# Patient Record
Sex: Female | Born: 1955 | Race: White | Hispanic: No | Marital: Married | Smoking: Never smoker
Health system: Southern US, Community
[De-identification: ages and names within clinical notes are randomized; demographics above are authoritative.]

## PROBLEM LIST (undated history)

## (undated) DIAGNOSIS — I10 Essential (primary) hypertension: Secondary | ICD-10-CM

## (undated) HISTORY — DX: Essential (primary) hypertension: I10

---
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
## 1986-05-12 HISTORY — PX: TUBAL LIGATION: SHX77

## 1995-05-13 HISTORY — PX: REVISION OF SCAR TISSUE RECTUS MUSCLE: SHX2351

## 1996-05-12 HISTORY — PX: VAGINAL HYSTERECTOMY: SUR661

## 2004-05-12 HISTORY — PX: BACK SURGERY: SHX140

## 2005-04-09 ENCOUNTER — Ambulatory Visit (HOSPITAL_COMMUNITY): Admission: RE | Admit: 2005-04-09 | Discharge: 2005-04-10 | Payer: Self-pay | Admitting: Neurosurgery

## 2006-01-14 ENCOUNTER — Emergency Department (HOSPITAL_COMMUNITY): Admission: EM | Admit: 2006-01-14 | Discharge: 2006-01-14 | Payer: Self-pay | Admitting: Emergency Medicine

## 2012-10-19 ENCOUNTER — Encounter: Payer: Self-pay | Admitting: Obstetrics and Gynecology

## 2012-10-20 ENCOUNTER — Ambulatory Visit (INDEPENDENT_AMBULATORY_CARE_PROVIDER_SITE_OTHER): Payer: BC Managed Care – PPO | Admitting: Gynecology

## 2012-10-20 ENCOUNTER — Encounter: Payer: Self-pay | Admitting: Gynecology

## 2012-10-20 ENCOUNTER — Ambulatory Visit: Payer: Self-pay | Admitting: Obstetrics and Gynecology

## 2012-10-20 VITALS — BP 138/80 | Ht 66.75 in | Wt 232.0 lb

## 2012-10-20 DIAGNOSIS — B372 Candidiasis of skin and nail: Secondary | ICD-10-CM

## 2012-10-20 DIAGNOSIS — Z01419 Encounter for gynecological examination (general) (routine) without abnormal findings: Secondary | ICD-10-CM

## 2012-10-20 DIAGNOSIS — F329 Major depressive disorder, single episode, unspecified: Secondary | ICD-10-CM

## 2012-10-20 DIAGNOSIS — F341 Dysthymic disorder: Secondary | ICD-10-CM

## 2012-10-20 DIAGNOSIS — Z79890 Hormone replacement therapy (postmenopausal): Secondary | ICD-10-CM

## 2012-10-20 DIAGNOSIS — I10 Essential (primary) hypertension: Secondary | ICD-10-CM

## 2012-10-20 DIAGNOSIS — N951 Menopausal and female climacteric states: Secondary | ICD-10-CM

## 2012-10-20 MED ORDER — NYSTATIN-TRIAMCINOLONE 100000-0.1 UNIT/GM-% EX OINT: TOPICAL_OINTMENT | Freq: Two times a day (BID) | CUTANEOUS | Status: DC

## 2012-10-20 NOTE — Progress Notes (Signed)
57 y.o.  Caucasian female   [id number] here for annual exam. Pt reports 60 pound weight gain since taking care of her mother 2y ago.  She states that she left her job to care for her mother, she's feeling "land Locked" with less social interactions.  She was recently started on Prozac to help with her anxiety, depression due to this recent change. Pt has been checked for thyroid and is normal.  She is currently on bio-identical hormones in pump form of estrogen and prometrium, she still does not feel right.    Patient's last menstrual period was 05/12/1996.          Sexually active: yes  The current method of family planning is tubal ligation and status post hysterectomy.    Exercising: no  none Last pap:04/23/10 neg Abnormal PAP:no Mammogram:04/30/12 neg BSE:yes Colonoscopy:2008 normal,repeat in 5-10 years DEXA:12/2011 normal Alcohol occ glass of wine Tobacco:no HGB PCP       Urine PCP Health Maintenance  Topic Date Due  . Pap Smear  05/27/1974  . Tetanus/tdap  05/28/1975  . Mammogram  05/27/2006  . Colonoscopy  05/27/2006  . Influenza Vaccine  01/10/2013    Family History  Problem Relation Age of Onset  . Hypertension Mother   . Cancer Mother     vulva cancer  . Osteoarthritis Mother   . Osteoporosis Mother   . Asthma Mother   . Stroke Father     There are no active problems to display for this patient.   Past Medical History  Diagnosis Date  . Hypertension     Past Surgical History  Procedure Laterality Date  . Vaginal hysterectomy  1998    TVH-DUB-Age 76  . Dilation and curettage of uterus  1976  . Revision of scar tissue rectus muscle Right 1997    Ovary  . Tubal ligation  1988    Allergies: Sulfa antibiotics  Current Outpatient Prescriptions  Medication Sig Dispense Refill  . aspirin 81 MG tablet Take 81 mg by mouth daily.      . Calcium Carbonate-Vitamin D (CALTRATE 600+D PO) Take by mouth daily.      Marland Kitchen FLUoxetine (PROZAC) 20 MG tablet Take 20 mg by  mouth daily.      Marland Kitchen lisinopril-hydrochlorothiazide (PRINZIDE,ZESTORETIC) 20-12.5 MG per tablet Take 1 tablet by mouth daily.      . Omega-3 Fatty Acids (FISH OIL PO) Take by mouth.      . progesterone (PROMETRIUM) 100 MG capsule Take 100 mg by mouth daily.      . Vitamin D, Ergocalciferol, (DRISDOL) 50000 UNITS CAPS Take 50,000 Units by mouth once a week.      . estradiol (ESTRACE) 1 MG tablet Take 1 mg by mouth daily.      Marland Kitchen UNABLE TO FIND Med Name: Biest 1.mg/0.125       No current facility-administered medications for this visit.    ROS: Pertinent items are noted in HPI.  Exam:    BP 138/80  Ht 5' 6.75" (1.695 m)  Wt 232 lb (105.235 kg)  BMI 36.63 kg/m2  LMP 05/12/1996 Weight change: @WEIGHTCHANGE @ Last 3 height recordings:  Ht Readings from Last 3 Encounters:  10/20/12 5' 6.75" (1.695 m)   General appearance: alert, cooperative and appears stated age Head: Normocephalic, without obvious abnormality, atraumatic Neck: no adenopathy, no carotid bruit, no JVD, supple, symmetrical, trachea midline and thyroid not enlarged, symmetric, no tenderness/mass/nodules Lungs: clear to auscultation bilaterally Breasts: normal appearance, no masses or tenderness Heart:  regular rate and rhythm, S1, S2 normal, no murmur, click, rub or gallop Abdomen: soft, non-tender; bowel sounds normal; no masses,  no organomegaly Extremities: extremities normal, atraumatic, no cyanosis or edema Skin: area under breasts bilaterally and under pannus erythematous, silky with sharp demarcations c/w yeast Lymph nodes: Cervical, supraclavicular, and axillary nodes normal. no inguinal nodes palpated Neurologic: Grossly normal   Pelvic: External genitalia:  no lesions              Urethra: normal appearing urethra with no masses, tenderness or lesions              Bartholins and Skenes: normal                 Vagina: normal appearing vagina with normal color and discharge, no lesions              Cervix:  removed surgically              Pap taken: no        Bimanual Exam:  Uterus:  absent                                      Adnexa:    no masses                                      Rectovaginal: Confirms                                      Anus:  normal sphincter tone, no lesions  A: well woman no contraindication to continue hormonal therapy Yeast dermatitis      P: mammogram qyr We had a long discussion regarding the FDA and ACOG stand re bio-identical and compounded hormones, the lack of regulation and dose to dose variabilitty.  We dsicussed FDA approved pump estrogen if she is so inclined.  Information was given and she wil let us know if she wants to change rx.  In addition pt is on porgestin, no uterus, we discussed the increased risk of breast cancer seen in the [facility] in the estrogen/progestin arm not seen in the estrogen only arm.  Re recommend she consider discon't progestin and she will discuss this with her prescribing MD. We discussed the change in relationship with her mother as she is now the caregiver,  We suggested she reach out to hospice to see if there are services available for her.  Discussed the importance of her socializing outside the house, which may allow her to stop the prozac. Discussed the importance of diet and exercising and to avoid stress eating. Yeast dermatitis-mycolog see order, recommend using antifungal powder after initial infection clears return annually or prn Discussed PAP guideline changes, importance of weight bearing exercises, calcium, vit D and balanced diet.  An After Visit Summary was printed and given to the patient.  Additional 100m discussing menopausal hormones and anxiety depression related to soical stressors

## 2012-10-21 ENCOUNTER — Telehealth: Payer: Self-pay | Admitting: Gynecology

## 2012-10-21 NOTE — Telephone Encounter (Signed)
Pt went to pick up yeast medication from pharmacy and it is too expensive. Is there something else she can  Prescribe a little cheaper?

## 2012-10-21 NOTE — Telephone Encounter (Signed)
Rx was sent to Pharmacy for Nystatin-Triamcinolone Ointment(Mycolog) this too expensive is there something else

## 2012-10-22 NOTE — Telephone Encounter (Signed)
Spoke with Pharmacist at [facility] about Rx for Mycolog a 60gram tube cost $219.00 and And a 15gram tube cost $11.99 this was the generic. I ok'd the 15gram tube with 1 additional refill Per Dr. Farrel Gobble

## 2013-05-23 ENCOUNTER — Encounter: Payer: Self-pay | Admitting: Gynecology

## 2013-07-01 ENCOUNTER — Encounter: Payer: Self-pay | Admitting: Gynecology

## 2013-07-04 ENCOUNTER — Encounter: Payer: Self-pay | Admitting: Gynecology

## 2013-10-12 ENCOUNTER — Encounter: Payer: Self-pay | Admitting: Podiatry

## 2013-10-19 ENCOUNTER — Ambulatory Visit (INDEPENDENT_AMBULATORY_CARE_PROVIDER_SITE_OTHER): Payer: BC Managed Care – PPO

## 2013-10-19 ENCOUNTER — Ambulatory Visit (INDEPENDENT_AMBULATORY_CARE_PROVIDER_SITE_OTHER): Payer: BC Managed Care – PPO | Admitting: Podiatry

## 2013-10-19 ENCOUNTER — Encounter: Payer: Self-pay | Admitting: Podiatry

## 2013-10-19 VITALS — BP 126/84 | HR 102 | Resp 16

## 2013-10-19 DIAGNOSIS — M722 Plantar fascial fibromatosis: Secondary | ICD-10-CM

## 2013-10-19 DIAGNOSIS — L02619 Cutaneous abscess of unspecified foot: Secondary | ICD-10-CM

## 2013-10-19 DIAGNOSIS — L03039 Cellulitis of unspecified toe: Secondary | ICD-10-CM

## 2013-10-19 MED ORDER — MELOXICAM 15 MG PO TABS: 15.0000 mg | ORAL_TABLET | Freq: Every day | ORAL | Status: DC

## 2013-10-19 MED ORDER — METHYLPREDNISOLONE (PAK) 4 MG PO TABS: ORAL_TABLET | ORAL | Status: DC

## 2013-10-19 MED ORDER — DOXYCYCLINE HYCLATE 100 MG PO TABS: 100.0000 mg | ORAL_TABLET | Freq: Two times a day (BID) | ORAL | Status: DC

## 2013-10-19 NOTE — Progress Notes (Signed)
Subjective:    Patient ID: Monica Quinn, female    DOB: 24-May-1955, 58 y.o.   MRN: [id number]  HPI Comments: i have right heel pain. i have had it for 6 months. It hurts to walk. The pain has remained the same. If i wear flat shoes its worse. i can not walk bare foot. i have tried stretching, ice, and relafen. i have otc inserts that i wear. i have a blister on my toe and i think it came from the insert. It does hurt and i have had it for 2 days. i have not done anything for it.  Foot Pain      Review of Systems  Musculoskeletal:       Difficulty walking  Allergic/Immunologic: Positive for food allergies.  All other systems reviewed and are negative.      Objective:   Physical Exam: I have reviewed her past medical history medications allergies surgeries social history and review of systems. Pulses are strongly palpable bilateral. Neurologic sensorium is intact per since once the monofilament. Deep tendon reflexes are brisk and equal bilateral. Muscle strength + over 5 dorsiflexors plantar flexors inverters everters all intrinsic musculature is intact. Orthopedic evaluation demonstrates all joints distal to the ankle a full range of motion without crepitation. She does have pain on palpation medial calcaneal tubercle of the right heel. Radiographic evaluation does demonstrate a soft tissue increase in density at the plantar fascial calcaneal insertion site with small plantar distally oriented calcaneal heel spur right foot. Cutaneous evaluation does demonstrate what appears to be too small insect bites to the third digit of the left foot distal tuft of the toe. There is some cellulitis and purulent exudate noted.        Assessment & Plan:  Assessment: Plantar fasciitis right foot. Left foot demonstrates possible insect bite third digit.  Plan: Injected the right heel today dispensed a prescription for Medrol Dosepak to be followed by met. We also started her on doxycycline for the  toe. Dispensed a night splint as well as a plantar fascial brace. We discussed the etiology pathology conservative versus surgical therapies. We discussed appropriate shoe gear stretching sizes ice therapy and shoe gear modifications. I will followup with her in one month and I did encourage soaking in Epsom salts and water for her left foot.

## 2013-10-21 ENCOUNTER — Ambulatory Visit: Payer: BC Managed Care – PPO | Admitting: Gynecology

## 2013-11-16 ENCOUNTER — Ambulatory Visit (INDEPENDENT_AMBULATORY_CARE_PROVIDER_SITE_OTHER): Payer: BC Managed Care – PPO | Admitting: Podiatry

## 2013-11-16 VITALS — BP 118/71 | HR 105 | Resp 16

## 2013-11-16 DIAGNOSIS — M722 Plantar fascial fibromatosis: Secondary | ICD-10-CM

## 2013-11-16 NOTE — Progress Notes (Signed)
She presents today for followup of her plantar fasciitis right foot. She states it is pretty much well with exception 1 standing on the top floor.  Objective: Vital signs are stable she is alert and oriented x3. Pulses are palpable right foot. She has no pain on palpation medial continued tubercle. She does have pain on palpation of the central calcaneal tubercle plantar aspect of the right foot.  Assessment: Resolving plantar fasciitis right foot.  Plan: Reinjected the right heel today with Kenalog and local anesthetic.

## 2013-12-12 ENCOUNTER — Other Ambulatory Visit: Payer: Self-pay | Admitting: Internal Medicine

## 2013-12-12 DIAGNOSIS — R51 Headache: Secondary | ICD-10-CM

## 2013-12-12 DIAGNOSIS — R209 Unspecified disturbances of skin sensation: Secondary | ICD-10-CM

## 2013-12-12 DIAGNOSIS — M542 Cervicalgia: Secondary | ICD-10-CM

## 2013-12-12 DIAGNOSIS — M545 Low back pain, unspecified: Secondary | ICD-10-CM

## 2013-12-19 ENCOUNTER — Ambulatory Visit: Payer: BC Managed Care – PPO | Admitting: Podiatry

## 2013-12-20 ENCOUNTER — Ambulatory Visit
Admission: RE | Admit: 2013-12-20 | Discharge: 2013-12-20 | Disposition: A | Discharge status: Home / Self Care | Payer: BC Managed Care – PPO | Source: Ambulatory Visit | Attending: Internal Medicine | Admitting: Internal Medicine

## 2013-12-20 DIAGNOSIS — R51 Headache: Secondary | ICD-10-CM

## 2013-12-20 DIAGNOSIS — R209 Unspecified disturbances of skin sensation: Secondary | ICD-10-CM

## 2013-12-20 DIAGNOSIS — M542 Cervicalgia: Secondary | ICD-10-CM

## 2013-12-20 MED ORDER — GADOBENATE DIMEGLUMINE 529 MG/ML IV SOLN
20.0000 mL | Freq: Once | INTRAVENOUS | Status: AC | PRN
  Administered 2013-12-20: 20 mL via INTRAVENOUS

## 2013-12-22 ENCOUNTER — Ambulatory Visit
Admission: RE | Admit: 2013-12-22 | Discharge: 2013-12-22 | Disposition: A | Discharge status: Home / Self Care | Payer: BC Managed Care – PPO | Source: Ambulatory Visit | Attending: Internal Medicine | Admitting: Internal Medicine

## 2013-12-22 DIAGNOSIS — R51 Headache: Secondary | ICD-10-CM

## 2013-12-22 DIAGNOSIS — M545 Low back pain, unspecified: Secondary | ICD-10-CM

## 2013-12-22 DIAGNOSIS — M542 Cervicalgia: Secondary | ICD-10-CM

## 2013-12-22 DIAGNOSIS — R209 Unspecified disturbances of skin sensation: Secondary | ICD-10-CM

## 2013-12-22 MED ORDER — GADOBENATE DIMEGLUMINE 529 MG/ML IV SOLN
20.0000 mL | Freq: Once | INTRAVENOUS | Status: AC | PRN
  Administered 2013-12-22: 20 mL via INTRAVENOUS

## 2014-01-03 ENCOUNTER — Ambulatory Visit (INDEPENDENT_AMBULATORY_CARE_PROVIDER_SITE_OTHER): Payer: BC Managed Care – PPO

## 2014-01-03 ENCOUNTER — Encounter: Payer: Self-pay | Admitting: Neurology

## 2014-01-03 ENCOUNTER — Ambulatory Visit (INDEPENDENT_AMBULATORY_CARE_PROVIDER_SITE_OTHER): Payer: BC Managed Care – PPO | Admitting: Neurology

## 2014-01-03 VITALS — BP 124/78 | HR 85 | Temp 97.9°F | Ht 67.5 in | Wt 231.0 lb

## 2014-01-03 DIAGNOSIS — G43109 Migraine with aura, not intractable, without status migrainosus: Secondary | ICD-10-CM

## 2014-01-03 DIAGNOSIS — G0489 Other myelitis: Secondary | ICD-10-CM

## 2014-01-03 DIAGNOSIS — G373 Acute transverse myelitis in demyelinating disease of central nervous system: Secondary | ICD-10-CM

## 2014-01-03 NOTE — Progress Notes (Signed)
Subjective:    Patient ID: Monica Quinn is a 58 y.o. female.  HPI     Monica Age, MD, PhD [facility] [address] [place], [place] [address]  Dear Dr. Laqueta Due,   I saw your patient, Monica Quinn, upon your kind request in my neurologic clinic today for initial consultation of her paresthesias and abnormal recent cervical spine MRI. The patient is unaccompanied today. As you know, Monica Quinn is a 58 year old right-handed woman with an underlying medical history of obesity, degenerative disc disease, degenerative joint disease, obesity, vitamin D deficiency, migraine with aura, hypertension and anxiety, status post hysterectomy in 1998, status post lumbar spine surgery in 2005, who reports numbness and tingling of her left lower extremity, which started about 4 weeks ago, gradually. She reports that Sx started with tingling and numbness in the toes on the L, then traveled up to the thigh and the groin area and gradually improved to where she has been essentially Sx free since 12/20/13.  She recently had lumbar spine MRI, cervical spine MRI and brain MRI on 8/11 and 12/22/13. Her lumbar spine MRI with and without contrast from 12/22/2013 showed lumbar spondylosis and degenerative disc disease, causing moderate impingement at L4-5, mild to moderate impingement at L5-S1, and mild impingement at L3-4. Her brain MRI with and without contrast on 12/20/2013 showed: Scattered foci of cerebral white matter T2 signal abnormality, nonspecific and not necessarily greater than expected for patient's Quinn. No lesions specifically suggestive of demyelinating disease are identified in the brain. Her cervical spine MRI with and without contrast showed an 64m T2 hyperintensity, enhancing focus in the spinal cord at level C3, his may represent a solitary focus of demyelination with enhancement suggesting active demyelination, small neoplasm such as ependymoma is not  excluded, however the cord does not appear expanded. In addition, I personally reviewed the images through the PACS system. She also has multilevel mild degenerative changes. 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 She has 2 biological sons and a stepson with great relationship with them. She is a non-smoker, drinks alcohol very rarely and has no FHx of lupus, MS, brain cancer. She has no Hx of trigeminal neuralgia, Bell's palsy, optic neuritis, but has had migraines with aura since she was 58yo since her first pregnancy, and has about 1 migraine every 3 months with visual field deficit, scintillating scotomata and facial tingling on one side. She did not have B/B dysfunction and no prodromal illness.  She had not noted a Lhermitte's sign, but noted it during the exam today. She has not noted any recurrence or worsening of symptoms when she takes a warm shower.  Mother had vulvar cancer and DVTs. Patient had one miscarriage at Quinn 58  She had extensive blood work in your office on 12/12/2013, I reviewed the test results. She had a normal CMP, lipid panel showed total cholesterol of less than 200, thyroid function testing was normal, CBC with differential was normal, UA was normal, ESR was normal at 6, vitamin B12 was 382 and folate was normal, vitamin D was on the lower end of the spectrum at 37, and SPEP was normal.  Her  Past Medical History Is Significant For: Past Medical History  Diagnosis Date  . Hypertension     Her Past Surgical History Is Significant For: Past Surgical History  Procedure Laterality Date  . Vaginal hysterectomy  1998    TVH-DUB-Quinn 27  . Dilation and curettage of uterus  1976  . Revision of scar tissue rectus  muscle Right 1997    Ovary  . Tubal ligation  1988  . Back surgery  2006    bulging disc,(2)    Her Family History Is Significant For: Family History  Problem Relation Quinn of Onset  . Hypertension Mother   . Cancer Mother     vulva cancer  . Osteoarthritis Mother   . Osteoporosis Mother   . Asthma Mother   . Stroke Father     Her Social History Is Significant For: History   Social History  . Marital Status: Married    Spouse Name: Louie Casa    Number of Children: 2  . Years of Education: 12   Occupational History  .      [employer]   Social History Main Topics  . Smoking status: Never Smoker   . Smokeless tobacco: Never Used  . Alcohol Use: 0.5 oz/week    1 drink(s) per week     Comment: 1-2 glasses of wine yearly  . Drug Use: No  . Sexual Activity: Yes    Partners: Male    Birth Control/ Protection: Surgical     Comment: TVH   Other Topics Concern  . None   Social History Narrative   Patient is right handed and resides with mother and husband,consumes caffeine once daily.    His Allergies Are:  Allergies  Allergen Reactions  . Peanuts [Peanut Oil] Swelling and Rash  . Sulfa Antibiotics Rash  :   Her Current Medications Are:  Outpatient Encounter Prescriptions as of 01/03/2014  Medication Sig  . aspirin 81 MG tablet Take 81 mg by mouth daily.  . Calcium Carbonate-Vitamin D (CALTRATE 600+D PO) Take by mouth daily.  Marland Kitchen FLUoxetine (PROZAC) 20 MG tablet Take 20 mg by mouth daily.  Marland Kitchen lisinopril-hydrochlorothiazide (PRINZIDE,ZESTORETIC) 20-12.5 MG per tablet Take 1 tablet by mouth daily.  . Omega-3 Fatty Acids (FISH OIL PO) Take by mouth.  Marland Kitchen UNABLE TO FIND Med Name: Biest 1.mg/0.125  . Vitamin D, Ergocalciferol, (DRISDOL) 50000 UNITS CAPS Take 50,000 Units by mouth once a week.  . [DISCONTINUED] doxycycline (VIBRA-TABS) 100 MG tablet Take 1 tablet (100 mg total) by mouth 2 (two) times daily.  . [DISCONTINUED] meloxicam (MOBIC) 15 MG tablet Take 1 tablet  (15 mg total) by mouth daily.  . [DISCONTINUED] methylPREDNIsolone (MEDROL DOSPACK) 4 MG tablet follow package directions   Review of Systems:  Out of a complete 14 point review of systems, all are reviewed and negative with the exception of these symptoms as listed below:  Review of Systems  Neurological: Positive for numbness.       Sometimes    Objective:  Neurologic Exam  Physical Exam Physical Examination:   Filed Vitals:   01/03/14 0817  BP: 124/78  Pulse: 85  Temp: 97.9 F (36.6 C)    General Examination: The patient is a very pleasant 58 y.o. female in no acute distress. She appears well-developed and well-nourished and well groomed.   HEENT: Normocephalic, atraumatic, pupils are equal, round and reactive to light and accommodation. Funduscopic exam is normal with sharp disc margins noted. Extraocular tracking is good without limitation to  gaze excursion or nystagmus noted. Normal smooth pursuit is noted. Hearing is grossly intact. Tympanic membranes are clear bilaterally. Face is symmetric with normal facial animation and normal facial sensation. Speech is clear with no dysarthria noted. There is no hypophonia. There is no lip, neck/head, jaw or voice tremor. Neck is supple with full range of passive and active motion. There are no carotid bruits on auscultation. Oropharynx exam reveals: mild mouth dryness, adequate dental hygiene and mild airway crowding, due to redundant soft palate. Mallampati is class II. Tongue protrudes centrally and palate elevates symmetrically. Tonsils are absent. Neck size is 15 7/8 inches. She has mild Lhermitte's phenomenon with neck flexion.  Chest: Clear to auscultation without wheezing, rhonchi or crackles noted.  Heart: S1+S2+0, regular and normal without murmurs, rubs or gallops noted.   Abdomen: Soft, non-tender and non-distended with normal bowel sounds appreciated on auscultation.  Extremities: There is no pitting edema in the distal  lower extremities bilaterally. Pedal pulses are intact.  Skin: Warm and dry without trophic changes noted. There are no varicose veins.  Musculoskeletal: exam reveals no obvious joint deformities, tenderness or joint swelling or erythema.   Neurologically:  Mental status: The patient is awake, alert and oriented in all 4 spheres. Her immediate and remote memory, attention, language skills and fund of knowledge are appropriate. There is no evidence of aphasia, agnosia, apraxia or anomia. Speech is clear with normal prosody and enunciation. Thought process is linear. Mood is normal and affect is normal.  Cranial nerves II - XII are as described above under HEENT exam. In addition: shoulder shrug is normal with equal shoulder height noted. Motor exam: Normal bulk, strength and tone is noted. There is no drift, tremor or rebound. Romberg is negative. Reflexes are 2+ in the UEs and RLE and 3+ in the LLE, no clonus. Babinski: Toes are left side extensor; right side flexor. Fine motor skills and coordination: intact with normal finger taps, normal hand movements, normal rapid alternating patting, normal foot taps and normal foot agility.  Cerebellar testing: No dysmetria or intention tremor on finger to nose testing. Heel to shin is unremarkable bilaterally. There is no truncal or gait ataxia.  Sensory exam: intact to light touch, pinprick, vibration, temperature sense in the upper and lower extremities.  Gait, station and balance: She stands easily. No veering to one side is noted. No leaning to one side is noted. Posture is Quinn-appropriate and stance is narrow based. Gait shows normal stride length and normal pace. No problems turning are noted. She turns en bloc. Tandem walk is unremarkable. Intact toe and heel stance is noted.               Assessment and Plan:   In summary, LYNDELL GILLYARD is a very pleasant 58 y.o.-year old female with an underlying medical history of obesity, degenerative disc  disease, degenerative joint disease, obesity, vitamin D deficiency, migraine with aura, hypertension and anxiety, status post hysterectomy in 1998, status post lumbar spine surgery in 2005, who reports numbness and tingling of her left lower extremity, which started a few weeks ago and has since then resolved almost completely, with the exception of mild Lhermitte's phenomenon and on examination she has mild asymmetric reflexes with hyper reflexia on the left lower extremity and upgoing toes. Otherwise she has a completely nonfocal exam and I reassured her in that regard. Since she has had resolution of her symptoms and primarily presented with sensory only symptoms. We do not need to  initiate symptomatic treatment with steroids. At this juncture the differential diagnosis is vast. Since she has telltale lesions in her brain concerning for MS, there is a chance that she just has a clinically isolated syndrome, or idiopathic transverse myelitis. She does not have any prodromal illness or prior autoimmune disease history. Differential diagnosis includes demyelinating lesion secondary to sarcoid, lupus or autoimmune disease, Lyme disease, thyroid dysfunction, vitamin deficiencies or toxicities, and ependymoma was listed in the MRI report but she does not have an expanded spinal cord. We will probably repeat cervical spine MRI in about 3-6 months depending on how she is doing and may be able to delay it to about 12 months if her workup has been entirely negative. Since she had a vitamin D level at the lower end of the spectrum I have asked her to start taking vitamin D, 1000-2000 units daily over-the-counter by mouth. She is advised that we will proceed with further workup in the form of blood work to include ANA, CRP, vitamin D level, vitamin B6 level, ACE level, RPR and HIV. We will do spinal fluid testing to include MS panel, ACE level, NMO antibodies. Furthermore, since sarcoid is on the differential diagnosis would  like to do a chest x-ray and we will also do visual evoked potentials to further get supportive data regarding the potential differential diagnosis of MS. She is advised that at this point we can not exclude the possibility that she may have underlying MS or may go on to develop MS but I reassured her that we will do workup and we do not necessarily have to start her on any disease modifying agents at this time and we can follow her clinically. We will at this time follow her closely since she has a number of test results that we need to go over. We will also keep her posted via phone regarding the test results as we receive them back. She is advised regarding the spinal fluid testing and is advised strongly to adhere to the instructions to lay on her back flat to avoid post LP headache. At this juncture we will see her in about a month. She can see my nurse practitioner, Sula Soda, and I will see her back after that.   I answered all her questions today and the patient was in agreement with the above outlined plan. Thank you very much for allowing me to participate in the care of this nice patient. If I can be of any further assistance to you please do not hesitate to call me at [phone].  Sincerely,   Monica Age, MD, PhD

## 2014-01-03 NOTE — Progress Notes (Signed)
Quick Note:  Please advise patient that today's visual evoked potentials were reported as normal which is very reassuring. Huston Foley, MD, PhD [facility]  ______

## 2014-01-03 NOTE — Patient Instructions (Signed)
We will do further workup with blood work, chest x-ray, lumbar puncture with spinal fluid testing, visual evoked potentials, and then see you back fairly soon. At this moment we will not treat you for your symptoms since you are feeling better and you had primarily sensory symptoms. We will keep you updated regarding her test results and in the near future Will also repeat your MRI cervical spine.

## 2014-01-03 NOTE — Procedures (Signed)
    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 The patient is being evaluated for possible optic nerve involvement.  Description: The visual evoked response test was performed today using 32 x 32 check sizes. The absolute latencies for the N1 and the P100 wave forms were within normal limits bilaterally. The amplitudes for the P100 wave forms were also within normal limits bilaterally. The visual acuity was 20/30 OD and 20/30 OS corrected.  Impression:  The visual evoked response test above was within normal limits bilaterally. No evidence of conduction slowing was seen within the anterior visual pathways on either side on today's evaluation.

## 2014-01-04 LAB — SPECIMEN STATUS REPORT

## 2014-01-04 NOTE — Progress Notes (Signed)
Quick Note:  Shared normal visual evoked test thru VM message on mobile ______

## 2014-01-06 ENCOUNTER — Other Ambulatory Visit: Payer: Self-pay | Admitting: Neurosurgery

## 2014-01-06 DIAGNOSIS — G959 Disease of spinal cord, unspecified: Secondary | ICD-10-CM

## 2014-01-10 NOTE — Progress Notes (Signed)
Quick Note:  Please call patient and advise her that thus far all labs are normal, which is very reassuring. Her serum ACE level is a little low but this in isolation does not have any bearing on her condition. Autoimmune antibodies and inflammatory markers, vitamin B6, all of these were normal. Her hypercoagulable panel is pending. She had her lumbar puncture yet? Huston Foley, MD, PhD [facility] ([facility])  ______

## 2014-01-11 ENCOUNTER — Other Ambulatory Visit: Payer: Self-pay | Admitting: Neurology

## 2014-01-11 ENCOUNTER — Ambulatory Visit
Admission: RE | Admit: 2014-01-11 | Discharge: 2014-01-11 | Disposition: A | Discharge status: Home / Self Care | Payer: BC Managed Care – PPO | Source: Ambulatory Visit | Attending: Neurology | Admitting: Neurology

## 2014-01-11 VITALS — BP 138/62 | HR 69

## 2014-01-11 DIAGNOSIS — G373 Acute transverse myelitis in demyelinating disease of central nervous system: Secondary | ICD-10-CM

## 2014-01-11 DIAGNOSIS — G43109 Migraine with aura, not intractable, without status migrainosus: Secondary | ICD-10-CM

## 2014-01-11 NOTE — Discharge Instructions (Signed)

## 2014-01-11 NOTE — Progress Notes (Addendum)
Two tiger-topped tubes of blood drawn for LP labs from right [facility] space without difficulty; site unremarkable.  CBG is 93.  Discharge instructions explained to patient pre-procedure.

## 2014-01-13 NOTE — Progress Notes (Signed)
Quick Note:  Please call patient back regarding her spinal fluid test results: So far no abnormalities were detected, in particular no abnormal blood cells, no abnormal protein or glucose. A lot of test results are still pending however and we will keep her posted. Huston Foley, MD, PhD [facility] ([facility]  ______

## 2014-01-14 LAB — CSF CULTURE
GRAM STAIN: NONE SEEN
ORGANISM ID, BACTERIA: NO GROWTH

## 2014-01-14 LAB — CSF CULTURE W GRAM STAIN: Gram Stain: NONE SEEN

## 2014-01-18 LAB — HYPERCOAGULABLE PANEL, COMPREHENSIVE
APTT: 27.9 s
AT III Act/Nor PPP Chro: 115 %
Act. Prt C Resist w/FV Defic.: 1.7 ratio — ABNORMAL LOW
Anticardiolipin Ab, IgG: <10 [GPL'U]
Anticardiolipin Ab, IgM: <10 [MPL'U]
Beta-2 Glycoprotein I, IgA: <10 SAU
Beta-2 Glycoprotein I, IgG: <10 SGU
Beta-2 Glycoprotein I, IgM: <10 SMU
DRVVT Screen Seconds: 37.7 s
Factor VII Antigen**: 166 % — ABNORMAL HIGH
Factor VIII Activity: 121 %
Hexagonal Phospholipid Neutral: 0 s
Homocysteine: 9.7 umol/L
Prot C Ag Act/Nor PPP Imm: 156 % — ABNORMAL HIGH
Prot S Ag Act/Nor PPP Imm: 159 % — ABNORMAL HIGH
Protein C Ag/FVII Ag Ratio**: 0.9 ratio
Protein S Ag/FVII Ag Ratio**: 1 ratio

## 2014-01-18 LAB — HIV ANTIBODY (ROUTINE TESTING W REFLEX)
HIV 1/O/2 Abs-Index Value: <1 (ref <1.00)
HIV-1/HIV-2 Ab: NONREACTIVE

## 2014-01-18 LAB — NMO IGG AUTOANTIBODIES: NMO-IgG: <1.5 U/mL (ref 0.0–3.0)

## 2014-01-18 LAB — FACTOR V LEIDEN

## 2014-01-18 LAB — ANA W/REFLEX: Anti Nuclear Antibody(ANA): NEGATIVE

## 2014-01-18 LAB — RHEUMATOID FACTOR: Rheumatoid fact SerPl-aCnc: <7 IU/mL (ref 0.0–13.9)

## 2014-01-18 LAB — RPR: RPR: NONREACTIVE

## 2014-01-18 LAB — VITAMIN B6: Vitamin B6: 30.8 ug/L (ref 2.0–32.8)

## 2014-01-18 LAB — VITAMIN E: Vitamin E (Alpha Tocopherol): 11.5 mg/L (ref 4.6–17.8)

## 2014-01-18 LAB — C-REACTIVE PROTEIN: CRP: 0.5 mg/L (ref 0.0–4.9)

## 2014-01-18 LAB — ANGIOTENSIN CONVERTING ENZYME: Angio Convert Enzyme: <14 U/L — ABNORMAL LOW (ref 14–82)

## 2014-01-18 LAB — NEUROMYELITIS OPTICA AUTOAB, IGG: NMO-IgG: NEGATIVE

## 2014-01-23 LAB — VIRAL CULTURE VIRC: Organism ID, Bacteria: NEGATIVE

## 2014-01-31 ENCOUNTER — Ambulatory Visit (INDEPENDENT_AMBULATORY_CARE_PROVIDER_SITE_OTHER): Payer: BC Managed Care – PPO | Admitting: Nurse Practitioner

## 2014-01-31 ENCOUNTER — Encounter: Payer: Self-pay | Admitting: Nurse Practitioner

## 2014-01-31 VITALS — BP 122/74 | HR 94 | Ht 67.0 in | Wt 236.0 lb

## 2014-01-31 DIAGNOSIS — G373 Acute transverse myelitis in demyelinating disease of central nervous system: Secondary | ICD-10-CM

## 2014-01-31 DIAGNOSIS — R208 Other disturbances of skin sensation: Secondary | ICD-10-CM

## 2014-01-31 DIAGNOSIS — R937 Abnormal findings on diagnostic imaging of other parts of musculoskeletal system: Secondary | ICD-10-CM | POA: Insufficient documentation

## 2014-01-31 DIAGNOSIS — G0489 Other myelitis: Secondary | ICD-10-CM

## 2014-01-31 DIAGNOSIS — R209 Unspecified disturbances of skin sensation: Secondary | ICD-10-CM

## 2014-01-31 NOTE — Patient Instructions (Signed)
Continue with plan for repeat MRI of the cervical spine, follow up with Dr. Jule Ser, and then followup with Dr. Frances Furbish as planned.  IF nothing is unchanged, we will take a watch and wait approach.  If you have any new symproms such as double vision, blurred vision that does not go away, or numbness in any extremity, please call the office to let us know.

## 2014-01-31 NOTE — Progress Notes (Addendum)
PATIENT: Monica Quinn DOB: [birth year]  REASON FOR VISIT: routine follow up for paresthesias, abnormal MRI HISTORY FROM: patient  HISTORY OF PRESENT ILLNESS: Ms. Monica Quinn was seen previously in our clinic by Dr. Rexene Alberts for initial consultation of her paresthesias and abnormal recent cervical spine MRI.   Since last visit, she had many tests completed, including LP, visual evoked potentials, which have all been normal. Pt's serum ACE level is a little low but this in isolation does not have any bearing on her condition. Autoimmune antibodies and inflammatory markers, vitamin B6, all of these were normal, pt's hypercoagulable panel shows very slightly elevated Protein C, Protein S and Factor VII. She is having no numbness in the left leg since 8/11/5. She notices an electrical shock sensation at times when she looks downward, sensation runs down her arms and sometimes her legs-- seems to be later in the day when she is more tired. She states this sensation does not occur every day. She has a repeat MRI scheduled for next week and then she has follow up with Dr. Sherwood Gambler the week after that. She is symptom free at this point. She denies any headache, blurred vision, diplopia, pain or numbness in her extremities.  01/03/14 (SA):  The patient is unaccompanied today. As you know, Ms. Monica Quinn is a 58 year old right-handed woman with an underlying medical history of obesity, degenerative disc disease, degenerative joint disease, obesity, vitamin D deficiency, migraine with aura, hypertension and anxiety, status post hysterectomy in 1998, status post lumbar spine surgery in 2005, who reports numbness and tingling of her left lower extremity, which started about 4 weeks ago, gradually. She reports that Sx started with tingling and numbness in the toes on the L, then traveled up to the thigh and the groin area and gradually improved to where she has been essentially Sx free since 12/20/13.  She recently had  lumbar spine MRI, cervical spine MRI and brain MRI on 8/11 and 12/22/13. Her lumbar spine MRI with and without contrast from 12/22/2013 showed lumbar spondylosis and degenerative disc disease, causing moderate impingement at L4-5, mild to moderate impingement at L5-S1, and mild impingement at L3-4. Her brain MRI with and without contrast on 12/20/2013 showed: Scattered foci of cerebral white matter T2 signal abnormality, nonspecific and not necessarily greater than expected for patient's age. No lesions specifically suggestive of demyelinating disease are identified in the brain. Her cervical spine MRI with and without contrast showed an 3m T2 hyperintensity, enhancing focus in the spinal cord at level C3, his may represent a solitary focus of demyelination with enhancement suggesting active demyelination, small neoplasm such as ependymoma is not excluded, however the cord does not appear expanded. In addition, I personally reviewed the images through the PACS system. She also has multilevel mild degenerative changes.  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 She has 2 biological sons and a stepson with great relationship with them. She is a non-smoker, drinks alcohol very rarely and has no FHx of lupus, MS, brain cancer. She has no Hx of trigeminal neuralgia, Bell's palsy, optic neuritis, but has had migraines with aura since she was 58yo since her first pregnancy, and has about  1 migraine every 3 months with visual field deficit, scintillating scotomata and facial tingling on one side. She did not have B/B dysfunction and no prodromal illness.  She had not noted a Lhermitte's sign, but noted it during the exam today. She has not noted any  recurrence or worsening of symptoms when she takes a warm shower.  Mother had vulvar cancer and DVTs. Patient had one miscarriage at age 71.  She had extensive blood work in your office on 12/12/2013, I reviewed the test results. She had a normal CMP, lipid panel showed total cholesterol of less than 200, thyroid function testing was normal, CBC with differential was normal, UA was normal, ESR was normal at 6, vitamin B12 was 382 and folate was normal, vitamin D was on the lower end of the spectrum at 37, and SPEP was normal.   REVIEW OF SYSTEMS: Full 14 system review of systems performed and notable only for:    ALLERGIES: Allergies  Allergen Reactions  . Peanuts [Peanut Oil] Swelling and Rash  . Sulfa Antibiotics Rash    HOME MEDICATIONS: Outpatient Prescriptions Prior to Visit  Medication Sig Dispense Refill  . aspirin 81 MG tablet Take 81 mg by mouth daily.      . Calcium Carbonate-Vitamin D (CALTRATE 600+D PO) Take by mouth daily.      Marland Kitchen FLUoxetine (PROZAC) 20 MG tablet Take 20 mg by mouth daily.      Marland Kitchen lisinopril-hydrochlorothiazide (PRINZIDE,ZESTORETIC) 20-12.5 MG per tablet Take 1 tablet by mouth daily.      . Omega-3 Fatty Acids (FISH OIL PO) Take by mouth.      Marland Kitchen UNABLE TO FIND Med Name: Biest 1.mg/0.125      . meloxicam (MOBIC) 15 MG tablet       . methylPREDNIsolone (MEDROL DOSPACK) 4 MG tablet       . Vitamin D, Ergocalciferol, (DRISDOL) 50000 UNITS CAPS Take 50,000 Units by mouth once a week.       No facility-administered medications prior to visit.    PHYSICAL EXAM Filed Vitals:   01/31/14 0856  BP: 122/74  Pulse: 94  Height: '5\' 7"'  (1.702 m)  Weight: 236 lb (107.049 kg)   Body mass index is 36.95 kg/(m^2).  General Examination: The patient is a very pleasant 58 y.o. female in no acute distress. She appears well-developed and well-nourished and well groomed.  HEENT: Normocephalic, atraumatic, pupils are equal, round and reactive to light and accommodation.  Funduscopic exam is normal with sharp disc margins noted. Extraocular tracking is good without limitation to gaze excursion or nystagmus noted. Normal smooth pursuit is noted. Hearing is grossly intact. Tympanic membranes are clear bilaterally. Face is symmetric with normal facial animation and normal facial sensation. Speech is clear with no dysarthria noted. There is no hypophonia. There is no lip, neck/head, jaw or voice tremor. Neck is supple with full range of passive and active motion. There are no carotid bruits on auscultation. Oropharynx exam reveals: mild mouth dryness, adequate dental hygiene and mild airway crowding, due to redundant soft palate. Mallampati is class II. Tongue protrudes centrally and palate elevates symmetrically. Tonsils are absent. Neck size is 15 7/8 inches. She has mild Lhermitte's phenomenon with neck flexion.  Chest: Clear to auscultation without wheezing, rhonchi or crackles noted.  Heart: S1+S2+0, regular and normal without murmurs, rubs or gallops noted.  Extremities: There is no pitting edema in the distal lower extremities bilaterally. Pedal pulses are intact.    Neurologically:  Mental status: The  patient is awake, alert and oriented in all 4 spheres. Her immediate and remote memory, attention, language skills and fund of knowledge are appropriate. There is no evidence of aphasia, agnosia, apraxia or anomia. Speech is clear with normal prosody and enunciation. Thought process is linear. Mood is normal and affect is normal.  Cranial nerves II - XII are as described above under HEENT exam. In addition: shoulder shrug is normal with equal shoulder height noted.  Motor exam: Normal bulk, strength and tone is noted. There is no drift, tremor or rebound. Romberg is negative. Reflexes are 2+ in the UEs and RLE and 3+ in the LLE, no clonus. Babinski: Toes are left side extensor; right side flexor. Fine motor skills and coordination: intact with normal finger taps, normal  hand movements, normal rapid alternating patting, normal foot taps and normal foot agility.  Cerebellar testing: No dysmetria or intention tremor on finger to nose testing. Heel to shin is unremarkable bilaterally. There is no truncal or gait ataxia.  Sensory exam: intact to light touch, pinprick, vibration, temperature sense in the upper and lower extremities.  Gait, station and balance: She stands easily. No veering to one side is noted. No leaning to one side is noted. Posture is age-appropriate and stance is narrow based. Gait shows normal stride length and normal pace. No problems turning are noted. She turns en bloc. Tandem walk is unremarkable. Intact toe and heel stance is noted.   12/20/13 MRI HEAD WITHOUT AND WITH CONTRAST  Incidental note is made of a partially empty sella. There is no evidence of acute infarct, intracranial hemorrhage, mass, midline shift, or extra-axial fluid collection. Scattered, small foci of T2 hyperintensity are present in the cerebral white matter. Ventricles and sulci are normal for age. There is no abnormal enhancement. Orbits are unremarkable. Mild left ethmoid air cell mucosal thickening is noted. Mastoid air cells are clear. Major intracranial vascular flow voids are preserved.   12/20/13 MRI CERVICAL SPINE WITHOUT AND WITH CONTRAST  Scattered foci of cerebral white matter T2 signal abnormality, nonspecific and not necessarily greater than expected for patient's  age. No lesions specifically suggestive of demyelinating disease are identified in the brain. 8 mm T2 hyperintense, enhancing focus in the spinal cord at C3. This may represent a solitary focus of demyelination with enhancement suggesting active demyelination. Small neoplasm, such as ependymoma, is not excluded, however the cord does not appear expanded.   ASSESSMENT: In summary, ADAYA GARMANY is a very pleasant 58 y.o.-year old female with an underlying medical history of obesity, degenerative disc  disease, degenerative joint disease, obesity, vitamin D deficiency, migraine with aura, hypertension and anxiety, status post hysterectomy in 1998, status post lumbar spine surgery in 2005, who reports numbness and tingling of her left lower extremity, which started a few weeks ago and has since then resolved almost completely, with the exception of mild Lhermitte's phenomenon and on examination she has mild asymmetric reflexes with hyper reflexia on the left lower extremity and upgoing toes. Otherwise she has a completely nonfocal exam and I reassured her in that regard. Since she has had resolution of her symptoms and primarily presented with sensory only symptoms. We do not need to initiate symptomatic treatment with steroids. At this juncture the differential diagnosis is vast. Since she has no telltale lesions in her brain concerning for MS, there is a chance that she just has a clinically isolated syndrome, or idiopathic transverse myelitis. She does not have any prodromal illness or prior autoimmune  disease history. Differential diagnosis includes demyelinating lesion secondary to sarcoid, lupus or autoimmune disease, Lyme disease, thyroid dysfunction, vitamin deficiencies or toxicities, and ependymoma was listed in the MRI report but she does not have an expanded spinal cord. We will probably repeat cervical spine MRI in about 6 months depending on how she is doing and may be able to delay it to about 12 months if her workup has been entirely negative. Since she had a vitamin D level at the lower end of the spectrum I have asked her to continue taking vitamin D, 1000-2000 units daily over-the-counter by mouth.   I reassured her that LP, and visual evoked potentials have all been normal. Pt's serum ACE level is a little low but this in isolation does not have any bearing on her condition. She is advised that at this point we can not exclude the possibility that she may have underlying MS or may go on to  develop MS but I reassured her that we will do workup and we do not necessarily have to start her on any disease modifying agents at this time and we can follow her clinically. She has a repeat MRI cervical spine scheduled next week and follow up with Dr. Astrid Drafts the week after.  She will see Dr. Rexene Alberts again at the end of October.  I answered all her questions today and the patient was in agreement with the above outlined plan.   Rudi Rummage LAM, MSN, FNP-BC, A/GNP-C 01/31/2014, 10:35 AM [facility] [address], [place], [place] [address] [phone]  Note: This document was prepared with digital dictation and possible smart phrase technology. Any transcriptional errors that result from this process are unintentional.  I reviewed the above note and documentation by the Nurse Practitioner and agree with the history, physical exam, assessment and plan as outlined above. Star Age, MD, PhD [facility])

## 2014-02-07 ENCOUNTER — Ambulatory Visit
Admission: RE | Admit: 2014-02-07 | Discharge: 2014-02-07 | Disposition: A | Discharge status: Home / Self Care | Payer: BC Managed Care – PPO | Source: Ambulatory Visit | Attending: Neurosurgery | Admitting: Neurosurgery

## 2014-02-07 DIAGNOSIS — G959 Disease of spinal cord, unspecified: Secondary | ICD-10-CM

## 2014-02-07 MED ORDER — GADOBENATE DIMEGLUMINE 529 MG/ML IV SOLN
20.0000 mL | Freq: Once | INTRAVENOUS | Status: AC | PRN
  Administered 2014-02-07: 20 mL via INTRAVENOUS

## 2014-02-09 LAB — FUNGUS CULTURE W SMEAR: SMEAR RESULT: NONE SEEN

## 2014-03-13 ENCOUNTER — Encounter: Payer: Self-pay | Admitting: Nurse Practitioner

## 2014-03-14 ENCOUNTER — Encounter: Payer: Self-pay | Admitting: Neurology

## 2014-03-14 ENCOUNTER — Ambulatory Visit (INDEPENDENT_AMBULATORY_CARE_PROVIDER_SITE_OTHER): Payer: BC Managed Care – PPO | Admitting: Neurology

## 2014-03-14 VITALS — BP 121/71 | HR 87 | Temp 98.3°F | Ht 67.0 in | Wt 243.0 lb

## 2014-03-14 DIAGNOSIS — G373 Acute transverse myelitis in demyelinating disease of central nervous system: Secondary | ICD-10-CM

## 2014-03-14 DIAGNOSIS — G0489 Other myelitis: Secondary | ICD-10-CM

## 2014-03-14 DIAGNOSIS — R937 Abnormal findings on diagnostic imaging of other parts of musculoskeletal system: Secondary | ICD-10-CM

## 2014-03-14 NOTE — Progress Notes (Signed)
Subjective:    Patient ID: Monica Quinn is a 58 y.o. female.  HPI     Interim history:   Monica Quinn is a 58 year old right-handed woman with an underlying medical history of obesity, degenerative disc disease, degenerative joint disease, obesity, vitamin D deficiency, migraine with aura, hypertension and anxiety, status post hysterectomy in 1998, status post lumbar spine surgery in 2005, who presents for follow-up consultation of her transverse myelitis. She is unaccompanied today. I first met her on 01/03/2014 at the request of her primary care physician, at which time the patient reported a 4 week history of numbness and tingling of her left lower extremity with plateauing. Cervical MRI showed an 43m T2 hyperintensity, enhancing focus in the spinal cord at level CIII. She had been essentially Sx free since 12/20/13. I suggested further blood work, this included RPR, vitamin B6, ANA, CRP, rheumatoid factor, vitamin E, ACE level, and NMO antibodies and hypercoagulable panel, HIV, all of which were unremarkable on 01/03/2014. CSF studies were negative. We checked for oligoclonal bands and NMO antibodies as well. We did visual evoked potentials on 01/03/2014 which were normal. We call patient with all her test results. She saw our nurse practitioner, LJeani Hawkingon 01/31/2014 for follow-up, and she was doing well at the time. She was supposed to have repeat cervical spine MRI under her neurosurgeon.  Today, she reports doing well. She has no more Monica Quinn. She has no residual neurological symptoms. She saw her surgeon, Dr. NSherwood Gambler He repeated her cervical spine MRI on 02/07/14: Near complete resolution of edema within the cervical cord at C3 since the comparison examination. No enhancement of the cord is identified on today's study and there is no new lesion. Findings are most in keeping with resolved infectious or inflammatory process. In addition, I have personally reviewed the images through the  PACS system and shared the findings with her as well on the computer screen. She still has stress. Her mother has not been doing as well. Unfortunately, because of her stress she has gained weight. She has an appointment with her primary care physician to help address her stress and strategies to improve her weight and her stress level.    She recently had lumbar spine MRI, cervical spine MRI and brain MRI on 8/11 and 12/22/13. Her lumbar spine MRI with and without contrast from 12/22/2013 showed lumbar spondylosis and degenerative disc disease, causing moderate impingement at L4-5, mild to moderate impingement at L5-S1, and mild impingement at L3-4. Her brain MRI with and without contrast on 12/20/2013 showed: Scattered foci of cerebral white matter T2 signal abnormality, nonspecific and not necessarily greater than expected for patient's age. No lesions specifically suggestive of demyelinating disease are identified in the brain. Her cervical spine MRI with and without contrast showed an 820mT2 hyperintensity, enhancing focus in the spinal cord at level C3, his may represent a solitary focus of demyelination with enhancement suggesting active demyelination, small neoplasm such as ependymoma is not excluded, however the cord does not appear expanded. In addition, I personally reviewed the images through the PACS system. She also has multilevel mild degenerative changes. 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 She has 2 biological  sons and a stepson with great relationship with them. She is a non-smoker, drinks alcohol very rarely and has no FHx of lupus, MS, brain cancer. She has no Hx of trigeminal neuralgia, Bell's palsy, optic neuritis, but has  had migraines with aura since she was 58 yo since her first pregnancy, and has about 1 migraine every 3 months with visual field deficit, scintillating scotomata and facial tingling on one side. She did not have B/B dysfunction and no prodromal illness.   She had not noted a Monica's Quinn, but noted it during the exam today. She has not noted any recurrence or worsening of symptoms when she takes a warm shower.   Mother had vulvar cancer and DVTs. Patient had one miscarriage at age 76.   She had extensive blood work in your office on 12/12/2013, I reviewed the test results. She had a normal CMP, lipid panel showed total cholesterol of less than 200, thyroid function testing was normal, CBC with differential was normal, UA was normal, ESR was normal at 6, vitamin B12 was 382 and folate was normal, vitamin D was on the lower end of the spectrum at 37, and SPEP was normal.   Her Past Medical History Is Significant For: Past Medical History  Diagnosis Date  . Hypertension     Her Past Surgical History Is Significant For: Past Surgical History  Procedure Laterality Date  . Vaginal hysterectomy  1998    TVH-DUB-Age 35  . Dilation and curettage of uterus  1976  . Revision of scar tissue rectus muscle Right 1997    Ovary  . Tubal ligation  1988  . Back surgery  2006    bulging disc,(2)    Her Family History Is Significant For: Family History  Problem Relation Age of Onset  . Hypertension Mother   . Cancer Mother     vulva cancer  . Osteoarthritis Mother   . Osteoporosis Mother   . Asthma Mother   . Stroke Father     Her Social History Is Significant For: History   Social History  . Marital Status: Married    Spouse Name: Louie Casa    Number of Children: 2  . Years of Education: 12   Occupational History  .      [employer]   Social History Main Topics  . Smoking status: Never Smoker   . Smokeless tobacco: Never Used  . Alcohol Use: 0.6 oz/week    1 Not specified per  week     Comment: 1-2 glasses of wine yearly  . Drug Use: No  . Sexual Activity:    Partners: Male    Birth Control/ Protection: Surgical     Comment: TVH   Other Topics Concern  . None   Social History Narrative   Patient is right handed and resides with mother and husband,consumes caffeine once daily.    Her Allergies Are:  Allergies  Allergen Reactions  . Peanuts [Peanut Oil] Swelling and Rash  . Sulfa Antibiotics Rash  :   Her Current Medications Are:  Outpatient Encounter Prescriptions as of 03/14/2014  Medication Sig  . aspirin 81 MG tablet Take 81 mg by mouth daily.  . Calcium Carbonate-Vitamin D (CALTRATE 600+D PO) Take by mouth daily.  Marland Kitchen FLUoxetine (PROZAC) 20 MG tablet Take 20 mg by mouth daily.  Marland Kitchen lisinopril-hydrochlorothiazide (PRINZIDE,ZESTORETIC) 20-12.5 MG per tablet Take 1 tablet by mouth daily.  . Omega-3 Fatty Acids (FISH OIL PO) Take by mouth.  . [DISCONTINUED] UNABLE  TO FIND Med Name: Biest 1.mg/0.125  :  Review of Systems:  Out of a complete 14 point review of systems, all are reviewed and negative with the exception of these symptoms as listed below:   Review of Systems  All other systems reviewed and are negative.   Objective:  Neurologic Exam  Physical Exam Physical Examination:   Filed Vitals:   03/14/14 1442  BP: 121/71  Pulse: 87  Temp: 98.3 F (36.8 C)    General Examination: The patient is a very pleasant 58 y.o. female in no acute distress. She appears well-developed and well-nourished and well groomed. She is in good spirits today.  HEENT: Normocephalic, atraumatic, pupils are equal, round and reactive to light and accommodation. Funduscopic exam is normal with sharp disc margins noted. Extraocular tracking is good without limitation to gaze excursion or nystagmus noted. Normal smooth pursuit is noted. Hearing is grossly intact. Tympanic membranes are clear bilaterally. Face is symmetric with normal facial animation and normal  facial sensation. Speech is clear with no dysarthria noted. There is no hypophonia. There is no lip, neck/head, jaw or voice tremor. Neck is supple with full range of passive and active motion. There are no carotid bruits on auscultation. Oropharynx exam reveals: mild mouth dryness, adequate dental hygiene and mild airway crowding, due to redundant soft palate. Mallampati is class II. Tongue protrudes centrally and palate elevates symmetrically. Tonsils are absent. She has no residual Monica's phenomenon with neck flexion.  Chest: Clear to auscultation without wheezing, rhonchi or crackles noted.  Heart: S1+S2+0, regular and normal without murmurs, rubs or gallops noted.   Abdomen: Soft, non-tender and non-distended with normal bowel sounds appreciated on auscultation.  Extremities: There is no pitting edema in the distal lower extremities bilaterally. Pedal pulses are intact.  Skin: Warm and dry without trophic changes noted. There are no varicose veins.  Musculoskeletal: exam reveals no obvious joint deformities, tenderness or joint swelling or erythema.   Neurologically:  Mental status: The patient is awake, alert and oriented in all 4 spheres. Her immediate and remote memory, attention, language skills and fund of knowledge are appropriate. There is no evidence of aphasia, agnosia, apraxia or anomia. Speech is clear with normal prosody and enunciation. Thought process is linear. Mood is normal and affect is normal.  Cranial nerves II - XII are as described above under HEENT exam. In addition: shoulder shrug is normal with equal shoulder height noted. Motor exam: Normal bulk, strength and tone is noted. There is no drift, tremor or rebound. Romberg is negative. Reflexes are 2+ in the UEs and bilateral lower extremities, no clonus. Babinski: Toes are downgoing bilaterally. Fine motor skills and coordination: intact with normal finger taps, normal hand movements, normal rapid alternating  patting, normal foot taps and normal foot agility.  Cerebellar testing: No dysmetria or intention tremor on finger to nose testing. Heel to shin is unremarkable bilaterally. There is no truncal or gait ataxia.  Sensory exam: intact to light touch, pinprick, vibration, temperature sense in the upper and lower extremities.  Gait, station and balance: She stands easily. No veering to one side is noted. No leaning to one side is noted. Posture is age-appropriate and stance is narrow based. Gait shows normal stride length and normal pace. No problems turning are noted. She turns en bloc. Tandem walk is unremarkable. Intact toe and heel stance is noted.               Assessment and Plan:   In  summary, CURTINA GRILLS is a very pleasant 58 year old female with an underlying medical history of obesity, degenerative disc disease, degenerative joint disease, obesity, vitamin D deficiency, migraine with aura, hypertension and anxiety, status post hysterectomy in 1998, status post lumbar spine surgery in 2005, who presents for follow-up consultation of her numbness and tingling of her left lower extremity, and abnormal spinal cord lesion in the cervical spine at C3 level in keeping with inflammation or demyelination, she had extensive workup particularly to look for inflammatory etiologies and demyelination such as multiple sclerosis or Devic's disease, all of which was unremarkable and very reassuring. Today we talked about all her test results. She also had normal visual evoked potentials. She had a recent cervical spine MRI repeated on 02/07/2014 which indeed showed significant improvement in her lesion and no enhancement. Her physical exam today is completely nonfocal and this is all very reassuring. Differential diagnosis for her problem includes isolated focal demyelination keeping with clinically isolated syndrome, idiopathic transverse myelitis, demyelinating lesion secondary to sarcoid, lupus or autoimmune  disease, Lyme disease, thyroid dysfunction, vitamin deficiencies or toxicities, and ependymoma (rare). However, at this juncture, we do not have an actual etiology and thankfully she has done really well and her exam is improved as well as her MRI finding. At this juncture, I have advised her that I should be able to see her on an as-needed basis. She was in agreement. We have done spinal fluid testing, which was completely normal. I answered all her questions today and the patient was in agreement with the above outlined plan.

## 2014-03-14 NOTE — Patient Instructions (Signed)
Your exam is normal and work up was normal. Your recent MRI neck looks improved.  I can see you back as needed!

## 2016-01-07 DIAGNOSIS — I10 Essential (primary) hypertension: Secondary | ICD-10-CM | POA: Diagnosis not present

## 2016-01-07 DIAGNOSIS — E559 Vitamin D deficiency, unspecified: Secondary | ICD-10-CM | POA: Diagnosis not present

## 2016-01-07 DIAGNOSIS — F419 Anxiety disorder, unspecified: Secondary | ICD-10-CM | POA: Diagnosis not present

## 2016-01-07 DIAGNOSIS — Z79899 Other long term (current) drug therapy: Secondary | ICD-10-CM | POA: Diagnosis not present

## 2016-03-13 DIAGNOSIS — E039 Hypothyroidism, unspecified: Secondary | ICD-10-CM | POA: Diagnosis not present

## 2016-04-07 DIAGNOSIS — I10 Essential (primary) hypertension: Secondary | ICD-10-CM | POA: Diagnosis not present

## 2016-04-07 DIAGNOSIS — E559 Vitamin D deficiency, unspecified: Secondary | ICD-10-CM | POA: Diagnosis not present

## 2016-04-07 DIAGNOSIS — E669 Obesity, unspecified: Secondary | ICD-10-CM | POA: Diagnosis not present

## 2016-04-07 DIAGNOSIS — F419 Anxiety disorder, unspecified: Secondary | ICD-10-CM | POA: Diagnosis not present

## 2016-04-21 DIAGNOSIS — Z23 Encounter for immunization: Secondary | ICD-10-CM | POA: Diagnosis not present

## 2016-06-13 DIAGNOSIS — H524 Presbyopia: Secondary | ICD-10-CM | POA: Diagnosis not present

## 2016-06-13 DIAGNOSIS — H5203 Hypermetropia, bilateral: Secondary | ICD-10-CM | POA: Diagnosis not present

## 2016-06-13 DIAGNOSIS — H47093 Other disorders of optic nerve, not elsewhere classified, bilateral: Secondary | ICD-10-CM | POA: Diagnosis not present

## 2016-07-23 DIAGNOSIS — I10 Essential (primary) hypertension: Secondary | ICD-10-CM | POA: Diagnosis not present

## 2016-07-23 DIAGNOSIS — E559 Vitamin D deficiency, unspecified: Secondary | ICD-10-CM | POA: Diagnosis not present

## 2016-07-23 DIAGNOSIS — Z79899 Other long term (current) drug therapy: Secondary | ICD-10-CM | POA: Diagnosis not present

## 2016-07-23 DIAGNOSIS — E039 Hypothyroidism, unspecified: Secondary | ICD-10-CM | POA: Diagnosis not present

## 2016-07-23 DIAGNOSIS — Z6838 Body mass index (BMI) 38.0-38.9, adult: Secondary | ICD-10-CM | POA: Diagnosis not present

## 2016-08-14 DIAGNOSIS — Z1231 Encounter for screening mammogram for malignant neoplasm of breast: Secondary | ICD-10-CM | POA: Diagnosis not present

## 2016-10-07 DIAGNOSIS — M7042 Prepatellar bursitis, left knee: Secondary | ICD-10-CM | POA: Diagnosis not present

## 2016-10-07 DIAGNOSIS — S86111A Strain of other muscle(s) and tendon(s) of posterior muscle group at lower leg level, right leg, initial encounter: Secondary | ICD-10-CM | POA: Diagnosis not present

## 2016-11-20 DIAGNOSIS — I10 Essential (primary) hypertension: Secondary | ICD-10-CM | POA: Diagnosis not present

## 2016-11-20 DIAGNOSIS — R350 Frequency of micturition: Secondary | ICD-10-CM | POA: Diagnosis not present

## 2016-11-20 DIAGNOSIS — F419 Anxiety disorder, unspecified: Secondary | ICD-10-CM | POA: Diagnosis not present

## 2016-11-20 DIAGNOSIS — R3121 Asymptomatic microscopic hematuria: Secondary | ICD-10-CM | POA: Diagnosis not present

## 2016-12-31 DIAGNOSIS — M25462 Effusion, left knee: Secondary | ICD-10-CM | POA: Diagnosis not present

## 2016-12-31 DIAGNOSIS — M25461 Effusion, right knee: Secondary | ICD-10-CM | POA: Diagnosis not present

## 2016-12-31 DIAGNOSIS — M25562 Pain in left knee: Secondary | ICD-10-CM | POA: Diagnosis not present

## 2016-12-31 DIAGNOSIS — M170 Bilateral primary osteoarthritis of knee: Secondary | ICD-10-CM | POA: Diagnosis not present

## 2016-12-31 DIAGNOSIS — M25569 Pain in unspecified knee: Secondary | ICD-10-CM | POA: Diagnosis not present

## 2016-12-31 DIAGNOSIS — M25561 Pain in right knee: Secondary | ICD-10-CM | POA: Diagnosis not present

## 2017-01-08 DIAGNOSIS — Z79899 Other long term (current) drug therapy: Secondary | ICD-10-CM | POA: Diagnosis not present

## 2017-01-08 DIAGNOSIS — E559 Vitamin D deficiency, unspecified: Secondary | ICD-10-CM | POA: Diagnosis not present

## 2017-01-08 DIAGNOSIS — I10 Essential (primary) hypertension: Secondary | ICD-10-CM | POA: Diagnosis not present

## 2017-01-08 DIAGNOSIS — R3129 Other microscopic hematuria: Secondary | ICD-10-CM | POA: Diagnosis not present

## 2017-01-08 DIAGNOSIS — E039 Hypothyroidism, unspecified: Secondary | ICD-10-CM | POA: Diagnosis not present

## 2017-02-04 DIAGNOSIS — M170 Bilateral primary osteoarthritis of knee: Secondary | ICD-10-CM | POA: Diagnosis not present

## 2017-02-11 DIAGNOSIS — M1711 Unilateral primary osteoarthritis, right knee: Secondary | ICD-10-CM | POA: Diagnosis not present

## 2017-02-11 DIAGNOSIS — M25561 Pain in right knee: Secondary | ICD-10-CM | POA: Diagnosis not present

## 2017-02-11 DIAGNOSIS — M1712 Unilateral primary osteoarthritis, left knee: Secondary | ICD-10-CM | POA: Diagnosis not present

## 2017-02-11 DIAGNOSIS — M25562 Pain in left knee: Secondary | ICD-10-CM | POA: Diagnosis not present

## 2017-02-13 DIAGNOSIS — M1711 Unilateral primary osteoarthritis, right knee: Secondary | ICD-10-CM | POA: Diagnosis not present

## 2017-02-13 DIAGNOSIS — M25561 Pain in right knee: Secondary | ICD-10-CM | POA: Diagnosis not present

## 2017-02-13 DIAGNOSIS — M25562 Pain in left knee: Secondary | ICD-10-CM | POA: Diagnosis not present

## 2017-02-13 DIAGNOSIS — M1712 Unilateral primary osteoarthritis, left knee: Secondary | ICD-10-CM | POA: Diagnosis not present

## 2017-02-18 DIAGNOSIS — M1712 Unilateral primary osteoarthritis, left knee: Secondary | ICD-10-CM | POA: Diagnosis not present

## 2017-02-18 DIAGNOSIS — M25561 Pain in right knee: Secondary | ICD-10-CM | POA: Diagnosis not present

## 2017-02-18 DIAGNOSIS — M25562 Pain in left knee: Secondary | ICD-10-CM | POA: Diagnosis not present

## 2017-02-18 DIAGNOSIS — M1711 Unilateral primary osteoarthritis, right knee: Secondary | ICD-10-CM | POA: Diagnosis not present

## 2017-02-20 DIAGNOSIS — M1711 Unilateral primary osteoarthritis, right knee: Secondary | ICD-10-CM | POA: Diagnosis not present

## 2017-02-20 DIAGNOSIS — M25561 Pain in right knee: Secondary | ICD-10-CM | POA: Diagnosis not present

## 2017-02-20 DIAGNOSIS — M1712 Unilateral primary osteoarthritis, left knee: Secondary | ICD-10-CM | POA: Diagnosis not present

## 2017-02-20 DIAGNOSIS — M25562 Pain in left knee: Secondary | ICD-10-CM | POA: Diagnosis not present

## 2017-02-25 DIAGNOSIS — M25561 Pain in right knee: Secondary | ICD-10-CM | POA: Diagnosis not present

## 2017-02-25 DIAGNOSIS — M25562 Pain in left knee: Secondary | ICD-10-CM | POA: Diagnosis not present

## 2017-02-25 DIAGNOSIS — M1712 Unilateral primary osteoarthritis, left knee: Secondary | ICD-10-CM | POA: Diagnosis not present

## 2017-02-25 DIAGNOSIS — M1711 Unilateral primary osteoarthritis, right knee: Secondary | ICD-10-CM | POA: Diagnosis not present

## 2017-02-27 DIAGNOSIS — M1711 Unilateral primary osteoarthritis, right knee: Secondary | ICD-10-CM | POA: Diagnosis not present

## 2017-02-27 DIAGNOSIS — M25561 Pain in right knee: Secondary | ICD-10-CM | POA: Diagnosis not present

## 2017-02-27 DIAGNOSIS — M25562 Pain in left knee: Secondary | ICD-10-CM | POA: Diagnosis not present

## 2017-02-27 DIAGNOSIS — M1712 Unilateral primary osteoarthritis, left knee: Secondary | ICD-10-CM | POA: Diagnosis not present

## 2017-03-04 DIAGNOSIS — M25562 Pain in left knee: Secondary | ICD-10-CM | POA: Diagnosis not present

## 2017-03-04 DIAGNOSIS — M1711 Unilateral primary osteoarthritis, right knee: Secondary | ICD-10-CM | POA: Diagnosis not present

## 2017-03-04 DIAGNOSIS — M1712 Unilateral primary osteoarthritis, left knee: Secondary | ICD-10-CM | POA: Diagnosis not present

## 2017-03-04 DIAGNOSIS — M25561 Pain in right knee: Secondary | ICD-10-CM | POA: Diagnosis not present

## 2017-03-06 DIAGNOSIS — M25562 Pain in left knee: Secondary | ICD-10-CM | POA: Diagnosis not present

## 2017-03-06 DIAGNOSIS — M1712 Unilateral primary osteoarthritis, left knee: Secondary | ICD-10-CM | POA: Diagnosis not present

## 2017-03-06 DIAGNOSIS — M25561 Pain in right knee: Secondary | ICD-10-CM | POA: Diagnosis not present

## 2017-03-06 DIAGNOSIS — M1711 Unilateral primary osteoarthritis, right knee: Secondary | ICD-10-CM | POA: Diagnosis not present

## 2017-03-23 DIAGNOSIS — M170 Bilateral primary osteoarthritis of knee: Secondary | ICD-10-CM | POA: Diagnosis not present

## 2017-04-22 DIAGNOSIS — M170 Bilateral primary osteoarthritis of knee: Secondary | ICD-10-CM | POA: Diagnosis not present

## 2017-05-13 DIAGNOSIS — M25561 Pain in right knee: Secondary | ICD-10-CM | POA: Diagnosis not present

## 2017-05-13 DIAGNOSIS — Z6837 Body mass index (BMI) 37.0-37.9, adult: Secondary | ICD-10-CM | POA: Diagnosis not present

## 2017-05-13 DIAGNOSIS — B001 Herpesviral vesicular dermatitis: Secondary | ICD-10-CM | POA: Diagnosis not present

## 2017-05-13 DIAGNOSIS — Z01818 Encounter for other preprocedural examination: Secondary | ICD-10-CM | POA: Diagnosis not present

## 2017-06-05 DIAGNOSIS — M1711 Unilateral primary osteoarthritis, right knee: Secondary | ICD-10-CM | POA: Diagnosis not present

## 2017-06-05 DIAGNOSIS — M1712 Unilateral primary osteoarthritis, left knee: Secondary | ICD-10-CM | POA: Diagnosis not present

## 2017-06-17 DIAGNOSIS — M1711 Unilateral primary osteoarthritis, right knee: Secondary | ICD-10-CM | POA: Diagnosis not present

## 2017-06-17 DIAGNOSIS — Z471 Aftercare following joint replacement surgery: Secondary | ICD-10-CM | POA: Diagnosis not present

## 2017-06-17 DIAGNOSIS — Z96651 Presence of right artificial knee joint: Secondary | ICD-10-CM | POA: Diagnosis not present

## 2017-06-17 DIAGNOSIS — E785 Hyperlipidemia, unspecified: Secondary | ICD-10-CM | POA: Diagnosis not present

## 2017-06-17 DIAGNOSIS — I10 Essential (primary) hypertension: Secondary | ICD-10-CM | POA: Diagnosis not present

## 2017-06-17 DIAGNOSIS — Z79899 Other long term (current) drug therapy: Secondary | ICD-10-CM | POA: Diagnosis not present

## 2017-06-17 DIAGNOSIS — M25561 Pain in right knee: Secondary | ICD-10-CM | POA: Diagnosis not present

## 2017-06-17 DIAGNOSIS — E039 Hypothyroidism, unspecified: Secondary | ICD-10-CM | POA: Diagnosis not present

## 2017-06-17 DIAGNOSIS — M179 Osteoarthritis of knee, unspecified: Secondary | ICD-10-CM | POA: Diagnosis not present

## 2017-06-17 DIAGNOSIS — G8918 Other acute postprocedural pain: Secondary | ICD-10-CM | POA: Diagnosis not present

## 2017-06-18 DIAGNOSIS — I10 Essential (primary) hypertension: Secondary | ICD-10-CM | POA: Diagnosis not present

## 2017-06-18 DIAGNOSIS — E039 Hypothyroidism, unspecified: Secondary | ICD-10-CM | POA: Diagnosis not present

## 2017-06-18 DIAGNOSIS — Z79899 Other long term (current) drug therapy: Secondary | ICD-10-CM | POA: Diagnosis not present

## 2017-06-18 DIAGNOSIS — E785 Hyperlipidemia, unspecified: Secondary | ICD-10-CM | POA: Diagnosis not present

## 2017-06-18 DIAGNOSIS — M1711 Unilateral primary osteoarthritis, right knee: Secondary | ICD-10-CM | POA: Diagnosis not present

## 2017-06-22 DIAGNOSIS — Z4733 Aftercare following explantation of knee joint prosthesis: Secondary | ICD-10-CM | POA: Diagnosis not present

## 2017-06-22 DIAGNOSIS — M1711 Unilateral primary osteoarthritis, right knee: Secondary | ICD-10-CM | POA: Diagnosis not present

## 2017-06-22 DIAGNOSIS — M25561 Pain in right knee: Secondary | ICD-10-CM | POA: Diagnosis not present

## 2017-06-22 DIAGNOSIS — M25661 Stiffness of right knee, not elsewhere classified: Secondary | ICD-10-CM | POA: Diagnosis not present

## 2017-06-24 DIAGNOSIS — M25661 Stiffness of right knee, not elsewhere classified: Secondary | ICD-10-CM | POA: Diagnosis not present

## 2017-06-24 DIAGNOSIS — M25561 Pain in right knee: Secondary | ICD-10-CM | POA: Diagnosis not present

## 2017-06-24 DIAGNOSIS — M1711 Unilateral primary osteoarthritis, right knee: Secondary | ICD-10-CM | POA: Diagnosis not present

## 2017-06-24 DIAGNOSIS — Z4733 Aftercare following explantation of knee joint prosthesis: Secondary | ICD-10-CM | POA: Diagnosis not present

## 2017-06-30 DIAGNOSIS — M25561 Pain in right knee: Secondary | ICD-10-CM | POA: Diagnosis not present

## 2017-06-30 DIAGNOSIS — M25661 Stiffness of right knee, not elsewhere classified: Secondary | ICD-10-CM | POA: Diagnosis not present

## 2017-06-30 DIAGNOSIS — Z4733 Aftercare following explantation of knee joint prosthesis: Secondary | ICD-10-CM | POA: Diagnosis not present

## 2017-06-30 DIAGNOSIS — M1711 Unilateral primary osteoarthritis, right knee: Secondary | ICD-10-CM | POA: Diagnosis not present

## 2017-07-03 DIAGNOSIS — M25561 Pain in right knee: Secondary | ICD-10-CM | POA: Diagnosis not present

## 2017-07-03 DIAGNOSIS — M25661 Stiffness of right knee, not elsewhere classified: Secondary | ICD-10-CM | POA: Diagnosis not present

## 2017-07-03 DIAGNOSIS — Z4733 Aftercare following explantation of knee joint prosthesis: Secondary | ICD-10-CM | POA: Diagnosis not present

## 2017-07-03 DIAGNOSIS — M1711 Unilateral primary osteoarthritis, right knee: Secondary | ICD-10-CM | POA: Diagnosis not present

## 2017-07-06 DIAGNOSIS — M25661 Stiffness of right knee, not elsewhere classified: Secondary | ICD-10-CM | POA: Diagnosis not present

## 2017-07-06 DIAGNOSIS — M1711 Unilateral primary osteoarthritis, right knee: Secondary | ICD-10-CM | POA: Diagnosis not present

## 2017-07-06 DIAGNOSIS — M25561 Pain in right knee: Secondary | ICD-10-CM | POA: Diagnosis not present

## 2017-07-06 DIAGNOSIS — Z4733 Aftercare following explantation of knee joint prosthesis: Secondary | ICD-10-CM | POA: Diagnosis not present

## 2017-07-08 DIAGNOSIS — M25661 Stiffness of right knee, not elsewhere classified: Secondary | ICD-10-CM | POA: Diagnosis not present

## 2017-07-08 DIAGNOSIS — Z4733 Aftercare following explantation of knee joint prosthesis: Secondary | ICD-10-CM | POA: Diagnosis not present

## 2017-07-08 DIAGNOSIS — M1711 Unilateral primary osteoarthritis, right knee: Secondary | ICD-10-CM | POA: Diagnosis not present

## 2017-07-08 DIAGNOSIS — M25561 Pain in right knee: Secondary | ICD-10-CM | POA: Diagnosis not present

## 2017-07-14 DIAGNOSIS — Z4733 Aftercare following explantation of knee joint prosthesis: Secondary | ICD-10-CM | POA: Diagnosis not present

## 2017-07-14 DIAGNOSIS — M1711 Unilateral primary osteoarthritis, right knee: Secondary | ICD-10-CM | POA: Diagnosis not present

## 2017-07-14 DIAGNOSIS — M25561 Pain in right knee: Secondary | ICD-10-CM | POA: Diagnosis not present

## 2017-07-14 DIAGNOSIS — M25661 Stiffness of right knee, not elsewhere classified: Secondary | ICD-10-CM | POA: Diagnosis not present

## 2017-07-17 DIAGNOSIS — M1711 Unilateral primary osteoarthritis, right knee: Secondary | ICD-10-CM | POA: Diagnosis not present

## 2017-07-17 DIAGNOSIS — M25661 Stiffness of right knee, not elsewhere classified: Secondary | ICD-10-CM | POA: Diagnosis not present

## 2017-07-17 DIAGNOSIS — M25561 Pain in right knee: Secondary | ICD-10-CM | POA: Diagnosis not present

## 2017-07-17 DIAGNOSIS — Z4733 Aftercare following explantation of knee joint prosthesis: Secondary | ICD-10-CM | POA: Diagnosis not present

## 2017-07-20 DIAGNOSIS — Z4733 Aftercare following explantation of knee joint prosthesis: Secondary | ICD-10-CM | POA: Diagnosis not present

## 2017-07-20 DIAGNOSIS — M25561 Pain in right knee: Secondary | ICD-10-CM | POA: Diagnosis not present

## 2017-07-20 DIAGNOSIS — M1711 Unilateral primary osteoarthritis, right knee: Secondary | ICD-10-CM | POA: Diagnosis not present

## 2017-07-20 DIAGNOSIS — M25661 Stiffness of right knee, not elsewhere classified: Secondary | ICD-10-CM | POA: Diagnosis not present

## 2017-07-22 DIAGNOSIS — M1711 Unilateral primary osteoarthritis, right knee: Secondary | ICD-10-CM | POA: Diagnosis not present

## 2017-07-22 DIAGNOSIS — M25561 Pain in right knee: Secondary | ICD-10-CM | POA: Diagnosis not present

## 2017-07-22 DIAGNOSIS — M25661 Stiffness of right knee, not elsewhere classified: Secondary | ICD-10-CM | POA: Diagnosis not present

## 2017-07-22 DIAGNOSIS — Z4733 Aftercare following explantation of knee joint prosthesis: Secondary | ICD-10-CM | POA: Diagnosis not present

## 2017-07-24 DIAGNOSIS — M25561 Pain in right knee: Secondary | ICD-10-CM | POA: Diagnosis not present

## 2017-07-24 DIAGNOSIS — Z4733 Aftercare following explantation of knee joint prosthesis: Secondary | ICD-10-CM | POA: Diagnosis not present

## 2017-07-24 DIAGNOSIS — M1711 Unilateral primary osteoarthritis, right knee: Secondary | ICD-10-CM | POA: Diagnosis not present

## 2017-07-24 DIAGNOSIS — M25661 Stiffness of right knee, not elsewhere classified: Secondary | ICD-10-CM | POA: Diagnosis not present

## 2017-07-27 DIAGNOSIS — M1711 Unilateral primary osteoarthritis, right knee: Secondary | ICD-10-CM | POA: Diagnosis not present

## 2017-07-27 DIAGNOSIS — M25661 Stiffness of right knee, not elsewhere classified: Secondary | ICD-10-CM | POA: Diagnosis not present

## 2017-07-27 DIAGNOSIS — Z4733 Aftercare following explantation of knee joint prosthesis: Secondary | ICD-10-CM | POA: Diagnosis not present

## 2017-07-27 DIAGNOSIS — M25561 Pain in right knee: Secondary | ICD-10-CM | POA: Diagnosis not present

## 2017-07-29 DIAGNOSIS — Z09 Encounter for follow-up examination after completed treatment for conditions other than malignant neoplasm: Secondary | ICD-10-CM | POA: Diagnosis not present

## 2017-07-30 DIAGNOSIS — M1711 Unilateral primary osteoarthritis, right knee: Secondary | ICD-10-CM | POA: Diagnosis not present

## 2017-07-30 DIAGNOSIS — M25661 Stiffness of right knee, not elsewhere classified: Secondary | ICD-10-CM | POA: Diagnosis not present

## 2017-07-30 DIAGNOSIS — M25561 Pain in right knee: Secondary | ICD-10-CM | POA: Diagnosis not present

## 2017-07-30 DIAGNOSIS — Z4733 Aftercare following explantation of knee joint prosthesis: Secondary | ICD-10-CM | POA: Diagnosis not present

## 2017-07-31 DIAGNOSIS — M25461 Effusion, right knee: Secondary | ICD-10-CM | POA: Diagnosis not present

## 2017-07-31 DIAGNOSIS — M25561 Pain in right knee: Secondary | ICD-10-CM | POA: Diagnosis not present

## 2017-08-04 DIAGNOSIS — Z4733 Aftercare following explantation of knee joint prosthesis: Secondary | ICD-10-CM | POA: Diagnosis not present

## 2017-08-04 DIAGNOSIS — M1711 Unilateral primary osteoarthritis, right knee: Secondary | ICD-10-CM | POA: Diagnosis not present

## 2017-08-04 DIAGNOSIS — M25561 Pain in right knee: Secondary | ICD-10-CM | POA: Diagnosis not present

## 2017-08-04 DIAGNOSIS — M25661 Stiffness of right knee, not elsewhere classified: Secondary | ICD-10-CM | POA: Diagnosis not present

## 2017-08-07 DIAGNOSIS — Z4733 Aftercare following explantation of knee joint prosthesis: Secondary | ICD-10-CM | POA: Diagnosis not present

## 2017-08-07 DIAGNOSIS — M25561 Pain in right knee: Secondary | ICD-10-CM | POA: Diagnosis not present

## 2017-08-07 DIAGNOSIS — M1711 Unilateral primary osteoarthritis, right knee: Secondary | ICD-10-CM | POA: Diagnosis not present

## 2017-08-07 DIAGNOSIS — M25661 Stiffness of right knee, not elsewhere classified: Secondary | ICD-10-CM | POA: Diagnosis not present

## 2017-08-10 DIAGNOSIS — M25661 Stiffness of right knee, not elsewhere classified: Secondary | ICD-10-CM | POA: Diagnosis not present

## 2017-08-10 DIAGNOSIS — Z4733 Aftercare following explantation of knee joint prosthesis: Secondary | ICD-10-CM | POA: Diagnosis not present

## 2017-08-10 DIAGNOSIS — M25561 Pain in right knee: Secondary | ICD-10-CM | POA: Diagnosis not present

## 2017-08-10 DIAGNOSIS — M1711 Unilateral primary osteoarthritis, right knee: Secondary | ICD-10-CM | POA: Diagnosis not present

## 2017-08-12 DIAGNOSIS — M1711 Unilateral primary osteoarthritis, right knee: Secondary | ICD-10-CM | POA: Diagnosis not present

## 2017-08-12 DIAGNOSIS — M25661 Stiffness of right knee, not elsewhere classified: Secondary | ICD-10-CM | POA: Diagnosis not present

## 2017-08-12 DIAGNOSIS — Z4733 Aftercare following explantation of knee joint prosthesis: Secondary | ICD-10-CM | POA: Diagnosis not present

## 2017-08-12 DIAGNOSIS — M25561 Pain in right knee: Secondary | ICD-10-CM | POA: Diagnosis not present

## 2017-08-14 DIAGNOSIS — M1711 Unilateral primary osteoarthritis, right knee: Secondary | ICD-10-CM | POA: Diagnosis not present

## 2017-08-14 DIAGNOSIS — Z4733 Aftercare following explantation of knee joint prosthesis: Secondary | ICD-10-CM | POA: Diagnosis not present

## 2017-08-14 DIAGNOSIS — M25561 Pain in right knee: Secondary | ICD-10-CM | POA: Diagnosis not present

## 2017-08-14 DIAGNOSIS — M25661 Stiffness of right knee, not elsewhere classified: Secondary | ICD-10-CM | POA: Diagnosis not present

## 2017-08-18 DIAGNOSIS — Z4733 Aftercare following explantation of knee joint prosthesis: Secondary | ICD-10-CM | POA: Diagnosis not present

## 2017-08-18 DIAGNOSIS — M25661 Stiffness of right knee, not elsewhere classified: Secondary | ICD-10-CM | POA: Diagnosis not present

## 2017-08-18 DIAGNOSIS — M1711 Unilateral primary osteoarthritis, right knee: Secondary | ICD-10-CM | POA: Diagnosis not present

## 2017-08-18 DIAGNOSIS — M25561 Pain in right knee: Secondary | ICD-10-CM | POA: Diagnosis not present

## 2017-08-21 DIAGNOSIS — M1711 Unilateral primary osteoarthritis, right knee: Secondary | ICD-10-CM | POA: Diagnosis not present

## 2017-08-21 DIAGNOSIS — M25561 Pain in right knee: Secondary | ICD-10-CM | POA: Diagnosis not present

## 2017-08-21 DIAGNOSIS — Z4733 Aftercare following explantation of knee joint prosthesis: Secondary | ICD-10-CM | POA: Diagnosis not present

## 2017-08-21 DIAGNOSIS — M25661 Stiffness of right knee, not elsewhere classified: Secondary | ICD-10-CM | POA: Diagnosis not present

## 2017-08-25 DIAGNOSIS — M1711 Unilateral primary osteoarthritis, right knee: Secondary | ICD-10-CM | POA: Diagnosis not present

## 2017-08-25 DIAGNOSIS — M25661 Stiffness of right knee, not elsewhere classified: Secondary | ICD-10-CM | POA: Diagnosis not present

## 2017-08-25 DIAGNOSIS — M25561 Pain in right knee: Secondary | ICD-10-CM | POA: Diagnosis not present

## 2017-08-25 DIAGNOSIS — Z4733 Aftercare following explantation of knee joint prosthesis: Secondary | ICD-10-CM | POA: Diagnosis not present

## 2017-08-28 DIAGNOSIS — Z4733 Aftercare following explantation of knee joint prosthesis: Secondary | ICD-10-CM | POA: Diagnosis not present

## 2017-08-28 DIAGNOSIS — M25661 Stiffness of right knee, not elsewhere classified: Secondary | ICD-10-CM | POA: Diagnosis not present

## 2017-08-28 DIAGNOSIS — M25561 Pain in right knee: Secondary | ICD-10-CM | POA: Diagnosis not present

## 2017-08-28 DIAGNOSIS — M1711 Unilateral primary osteoarthritis, right knee: Secondary | ICD-10-CM | POA: Diagnosis not present

## 2017-09-02 DIAGNOSIS — M25661 Stiffness of right knee, not elsewhere classified: Secondary | ICD-10-CM | POA: Diagnosis not present

## 2017-09-02 DIAGNOSIS — M1711 Unilateral primary osteoarthritis, right knee: Secondary | ICD-10-CM | POA: Diagnosis not present

## 2017-09-02 DIAGNOSIS — M25561 Pain in right knee: Secondary | ICD-10-CM | POA: Diagnosis not present

## 2017-09-02 DIAGNOSIS — Z4733 Aftercare following explantation of knee joint prosthesis: Secondary | ICD-10-CM | POA: Diagnosis not present

## 2017-09-09 DIAGNOSIS — M1711 Unilateral primary osteoarthritis, right knee: Secondary | ICD-10-CM | POA: Diagnosis not present

## 2017-09-09 DIAGNOSIS — M25661 Stiffness of right knee, not elsewhere classified: Secondary | ICD-10-CM | POA: Diagnosis not present

## 2017-09-09 DIAGNOSIS — M25561 Pain in right knee: Secondary | ICD-10-CM | POA: Diagnosis not present

## 2017-09-09 DIAGNOSIS — Z4733 Aftercare following explantation of knee joint prosthesis: Secondary | ICD-10-CM | POA: Diagnosis not present

## 2017-09-16 DIAGNOSIS — E039 Hypothyroidism, unspecified: Secondary | ICD-10-CM | POA: Diagnosis not present

## 2017-09-16 DIAGNOSIS — F419 Anxiety disorder, unspecified: Secondary | ICD-10-CM | POA: Diagnosis not present

## 2017-09-16 DIAGNOSIS — Z79899 Other long term (current) drug therapy: Secondary | ICD-10-CM | POA: Diagnosis not present

## 2017-09-16 DIAGNOSIS — E559 Vitamin D deficiency, unspecified: Secondary | ICD-10-CM | POA: Diagnosis not present

## 2017-09-16 DIAGNOSIS — F5101 Primary insomnia: Secondary | ICD-10-CM | POA: Diagnosis not present

## 2017-09-16 DIAGNOSIS — I10 Essential (primary) hypertension: Secondary | ICD-10-CM | POA: Diagnosis not present

## 2017-09-16 DIAGNOSIS — Z1231 Encounter for screening mammogram for malignant neoplasm of breast: Secondary | ICD-10-CM | POA: Diagnosis not present

## 2018-03-04 DIAGNOSIS — L820 Inflamed seborrheic keratosis: Secondary | ICD-10-CM | POA: Diagnosis not present

## 2018-03-04 DIAGNOSIS — L821 Other seborrheic keratosis: Secondary | ICD-10-CM | POA: Diagnosis not present

## 2018-03-04 DIAGNOSIS — D485 Neoplasm of uncertain behavior of skin: Secondary | ICD-10-CM | POA: Diagnosis not present

## 2018-03-04 DIAGNOSIS — D1801 Hemangioma of skin and subcutaneous tissue: Secondary | ICD-10-CM | POA: Diagnosis not present

## 2018-03-04 DIAGNOSIS — L304 Erythema intertrigo: Secondary | ICD-10-CM | POA: Diagnosis not present

## 2018-03-17 DIAGNOSIS — Z79899 Other long term (current) drug therapy: Secondary | ICD-10-CM | POA: Diagnosis not present

## 2018-03-17 DIAGNOSIS — F419 Anxiety disorder, unspecified: Secondary | ICD-10-CM | POA: Diagnosis not present

## 2018-03-17 DIAGNOSIS — E559 Vitamin D deficiency, unspecified: Secondary | ICD-10-CM | POA: Diagnosis not present

## 2018-03-17 DIAGNOSIS — I10 Essential (primary) hypertension: Secondary | ICD-10-CM | POA: Diagnosis not present

## 2018-03-17 DIAGNOSIS — E039 Hypothyroidism, unspecified: Secondary | ICD-10-CM | POA: Diagnosis not present

## 2018-04-01 DIAGNOSIS — Z1212 Encounter for screening for malignant neoplasm of rectum: Secondary | ICD-10-CM | POA: Diagnosis not present

## 2018-04-01 DIAGNOSIS — Z1211 Encounter for screening for malignant neoplasm of colon: Secondary | ICD-10-CM | POA: Diagnosis not present

## 2018-07-15 DIAGNOSIS — Z79899 Other long term (current) drug therapy: Secondary | ICD-10-CM | POA: Diagnosis not present

## 2018-07-15 DIAGNOSIS — E039 Hypothyroidism, unspecified: Secondary | ICD-10-CM | POA: Diagnosis not present

## 2018-07-15 DIAGNOSIS — I10 Essential (primary) hypertension: Secondary | ICD-10-CM | POA: Diagnosis not present

## 2018-07-15 DIAGNOSIS — R5383 Other fatigue: Secondary | ICD-10-CM | POA: Diagnosis not present

## 2018-07-15 DIAGNOSIS — M2550 Pain in unspecified joint: Secondary | ICD-10-CM | POA: Diagnosis not present

## 2018-07-19 DIAGNOSIS — M25511 Pain in right shoulder: Secondary | ICD-10-CM | POA: Diagnosis not present

## 2018-11-01 DIAGNOSIS — Z1231 Encounter for screening mammogram for malignant neoplasm of breast: Secondary | ICD-10-CM | POA: Diagnosis not present

## 2018-11-01 DIAGNOSIS — Z1239 Encounter for other screening for malignant neoplasm of breast: Secondary | ICD-10-CM | POA: Diagnosis not present

## 2018-12-23 DIAGNOSIS — D485 Neoplasm of uncertain behavior of skin: Secondary | ICD-10-CM | POA: Diagnosis not present

## 2018-12-23 DIAGNOSIS — D1801 Hemangioma of skin and subcutaneous tissue: Secondary | ICD-10-CM | POA: Diagnosis not present

## 2019-04-13 DIAGNOSIS — N3941 Urge incontinence: Secondary | ICD-10-CM | POA: Diagnosis not present

## 2019-04-13 DIAGNOSIS — I10 Essential (primary) hypertension: Secondary | ICD-10-CM | POA: Diagnosis not present

## 2019-04-13 DIAGNOSIS — E559 Vitamin D deficiency, unspecified: Secondary | ICD-10-CM | POA: Diagnosis not present

## 2019-04-13 DIAGNOSIS — Z79899 Other long term (current) drug therapy: Secondary | ICD-10-CM | POA: Diagnosis not present

## 2019-04-13 DIAGNOSIS — E039 Hypothyroidism, unspecified: Secondary | ICD-10-CM | POA: Diagnosis not present

## 2019-04-15 DIAGNOSIS — F330 Major depressive disorder, recurrent, mild: Secondary | ICD-10-CM | POA: Diagnosis not present

## 2019-04-15 DIAGNOSIS — I10 Essential (primary) hypertension: Secondary | ICD-10-CM | POA: Diagnosis not present

## 2019-04-15 DIAGNOSIS — E039 Hypothyroidism, unspecified: Secondary | ICD-10-CM | POA: Diagnosis not present

## 2019-04-15 DIAGNOSIS — F419 Anxiety disorder, unspecified: Secondary | ICD-10-CM | POA: Diagnosis not present

## 2019-08-16 DIAGNOSIS — R7301 Impaired fasting glucose: Secondary | ICD-10-CM | POA: Diagnosis not present

## 2019-08-16 DIAGNOSIS — E559 Vitamin D deficiency, unspecified: Secondary | ICD-10-CM | POA: Diagnosis not present

## 2019-08-16 DIAGNOSIS — E039 Hypothyroidism, unspecified: Secondary | ICD-10-CM | POA: Diagnosis not present

## 2019-10-03 DIAGNOSIS — H2513 Age-related nuclear cataract, bilateral: Secondary | ICD-10-CM | POA: Diagnosis not present

## 2019-11-24 DIAGNOSIS — Z1231 Encounter for screening mammogram for malignant neoplasm of breast: Secondary | ICD-10-CM | POA: Diagnosis not present

## 2019-11-24 DIAGNOSIS — Z1239 Encounter for other screening for malignant neoplasm of breast: Secondary | ICD-10-CM | POA: Diagnosis not present

## 2019-11-25 DIAGNOSIS — Z6841 Body Mass Index (BMI) 40.0 and over, adult: Secondary | ICD-10-CM | POA: Diagnosis not present

## 2019-11-25 DIAGNOSIS — Z79899 Other long term (current) drug therapy: Secondary | ICD-10-CM | POA: Diagnosis not present

## 2019-11-25 DIAGNOSIS — Z1331 Encounter for screening for depression: Secondary | ICD-10-CM | POA: Diagnosis not present

## 2019-11-25 DIAGNOSIS — Z0000 Encounter for general adult medical examination without abnormal findings: Secondary | ICD-10-CM | POA: Diagnosis not present

## 2019-12-29 DIAGNOSIS — L304 Erythema intertrigo: Secondary | ICD-10-CM | POA: Diagnosis not present

## 2019-12-29 DIAGNOSIS — D224 Melanocytic nevi of scalp and neck: Secondary | ICD-10-CM | POA: Diagnosis not present

## 2019-12-29 DIAGNOSIS — Z7189 Other specified counseling: Secondary | ICD-10-CM | POA: Diagnosis not present

## 2020-02-24 DIAGNOSIS — H1013 Acute atopic conjunctivitis, bilateral: Secondary | ICD-10-CM | POA: Diagnosis not present

## 2020-02-27 DIAGNOSIS — B356 Tinea cruris: Secondary | ICD-10-CM | POA: Diagnosis not present

## 2020-02-27 DIAGNOSIS — B354 Tinea corporis: Secondary | ICD-10-CM | POA: Diagnosis not present

## 2020-03-26 DIAGNOSIS — Z79899 Other long term (current) drug therapy: Secondary | ICD-10-CM | POA: Diagnosis not present

## 2020-03-26 DIAGNOSIS — B356 Tinea cruris: Secondary | ICD-10-CM | POA: Diagnosis not present

## 2020-03-26 DIAGNOSIS — Z6841 Body Mass Index (BMI) 40.0 and over, adult: Secondary | ICD-10-CM | POA: Diagnosis not present

## 2020-03-26 DIAGNOSIS — R21 Rash and other nonspecific skin eruption: Secondary | ICD-10-CM | POA: Diagnosis not present

## 2021-01-23 DIAGNOSIS — Z1239 Encounter for other screening for malignant neoplasm of breast: Secondary | ICD-10-CM | POA: Diagnosis not present

## 2021-01-23 DIAGNOSIS — Z1231 Encounter for screening mammogram for malignant neoplasm of breast: Secondary | ICD-10-CM | POA: Diagnosis not present

## 2021-01-24 DIAGNOSIS — R7301 Impaired fasting glucose: Secondary | ICD-10-CM | POA: Diagnosis not present

## 2021-01-24 DIAGNOSIS — E559 Vitamin D deficiency, unspecified: Secondary | ICD-10-CM | POA: Diagnosis not present

## 2021-01-24 DIAGNOSIS — I10 Essential (primary) hypertension: Secondary | ICD-10-CM | POA: Diagnosis not present

## 2021-01-24 DIAGNOSIS — Z6835 Body mass index (BMI) 35.0-35.9, adult: Secondary | ICD-10-CM | POA: Diagnosis not present

## 2021-01-24 DIAGNOSIS — Z0000 Encounter for general adult medical examination without abnormal findings: Secondary | ICD-10-CM | POA: Diagnosis not present
